# Patient Record
Sex: Male | Born: 1960 | Hispanic: No | Marital: Single | Smoking: Former smoker
Health system: Southern US, Community
[De-identification: ages and names within clinical notes are randomized; demographics above are authoritative.]

## PROBLEM LIST (undated history)

## (undated) DIAGNOSIS — K219 Gastro-esophageal reflux disease without esophagitis: Secondary | ICD-10-CM

## (undated) DIAGNOSIS — Z87898 Personal history of other specified conditions: Secondary | ICD-10-CM

## (undated) DIAGNOSIS — K529 Noninfective gastroenteritis and colitis, unspecified: Secondary | ICD-10-CM

---

## 2006-07-30 ENCOUNTER — Emergency Department (HOSPITAL_COMMUNITY): Admission: EM | Admit: 2006-07-30 | Discharge: 2006-07-30 | Payer: Self-pay | Admitting: Emergency Medicine

## 2019-10-11 ENCOUNTER — Ambulatory Visit: Payer: Self-pay

## 2019-10-11 ENCOUNTER — Ambulatory Visit (HOSPITAL_BASED_OUTPATIENT_CLINIC_OR_DEPARTMENT_OTHER)
Admission: RE | Admit: 2019-10-11 | Discharge: 2019-10-11 | Disposition: A | Discharge status: Home / Self Care | Payer: 59 | Source: Ambulatory Visit | Attending: Family Medicine | Admitting: Family Medicine

## 2019-10-11 ENCOUNTER — Ambulatory Visit (INDEPENDENT_AMBULATORY_CARE_PROVIDER_SITE_OTHER): Payer: 59 | Admitting: Family Medicine

## 2019-10-11 ENCOUNTER — Encounter: Payer: Self-pay | Admitting: Family Medicine

## 2019-10-11 ENCOUNTER — Other Ambulatory Visit: Payer: Self-pay

## 2019-10-11 VITALS — BP 132/87 | HR 64 | Ht 68.0 in | Wt 214.0 lb

## 2019-10-11 DIAGNOSIS — M1711 Unilateral primary osteoarthritis, right knee: Secondary | ICD-10-CM

## 2019-10-11 NOTE — Assessment & Plan Note (Signed)
Generative changes appreciated with joint space narrowing as likely the source of his pain.  Meniscus did not have significant degenerative changes appreciated. -Counseled on home exercise therapy and supportive care. -Provided Pennsaid sample. -X-ray. -Could consider injection, physical therapy

## 2019-10-11 NOTE — Progress Notes (Signed)
Medication Samples have been provided to the patient.  Drug name: Pennsaid       Strength: 2%      Qty: 1 box  LOT: [id number]  Exp.Date: 04/2020  Dosing instructions: use a pea size amount and rub gently.  The patient has been instructed regarding the correct time, dose, and frequency of taking this medication, including desired effects and most common side effects.   Kathi Simpers, MA 10:40 AM 10/11/2019

## 2019-10-11 NOTE — Progress Notes (Signed)
Casey Patterson - 59 y.o. male MRN [id number]  Date of birth: 1961-01-20  SUBJECTIVE:  Including CC & ROS.  Chief Complaint  Patient presents with  . Knee Pain    right    Casey Patterson is a 59 y.o. male that is presenting with right knee pain.  The pain is anterior medial in nature.  He reports having swelling intermittently.  Denies history of surgery.  No specific inciting event.  Has not tried anything for the pain.   Review of Systems See HPI   HISTORY: Past Medical, Surgical, Social, and Family History Reviewed & Updated per EMR.   Pertinent Historical Findings include:  History reviewed. No pertinent past medical history.  History reviewed. No pertinent surgical history.  History reviewed. No pertinent family history.  Social History   Socioeconomic History  . Marital status: Unknown    Spouse name: Not on file  . Number of children: Not on file  . Years of education: Not on file  . Highest education level: Not on file  Occupational History  . Not on file  Tobacco Use  . Smoking status: Former Games developer  . Smokeless tobacco: Never Used  Substance and Sexual Activity  . Alcohol use: Not on file  . Drug use: Not on file  . Sexual activity: Not on file  Other Topics Concern  . Not on file  Social History Narrative  . Not on file   Social Determinants of Health   Financial Resource Strain:   . Difficulty of Paying Living Expenses:   Food Insecurity:   . Worried About Programme researcher, broadcasting/film/video in the Last Year:   . Barista in the Last Year:   Transportation Needs:   . Freight forwarder (Medical):   Marland Kitchen Lack of Transportation (Non-Medical):   Physical Activity:   . Days of Exercise per Week:   . Minutes of Exercise per Session:   Stress:   . Feeling of Stress :   Social Connections:   . Frequency of Communication with Friends and Family:   . Frequency of Social Gatherings with Friends and Family:   . Attends Religious Services:   . Active Member  of Clubs or Organizations:   . Attends Banker Meetings:   Marland Kitchen Marital Status:   Intimate Partner Violence:   . Fear of Current or Ex-Partner:   . Emotionally Abused:   Marland Kitchen Physically Abused:   . Sexually Abused:      PHYSICAL EXAM:  VS: BP (!) 132/87   Pulse 64   Ht 5\' 8"  (1.727 m)   Wt (!) 214 lb (97.1 kg)   BMI 32.54 kg/m  Physical Exam Gen: NAD, alert, cooperative with exam, well-appearing MSK:  Right knee: Mild effusion. Tenderness palpation of the medial joint space. Normal range of motion. No instability with valgus or varus stress testing. Negative McMurray's test. Neurovascularly intact  Limited ultrasound: Right knee:  Mild to moderate effusion. Normal-appearing quadricep and patellar tendon. Medial joint space narrowing with normal-appearing meniscus. Lateral joint space narrowing with normal meniscus.  Summary: Degenerative change appreciated in the joint space.  Ultrasound and interpretation by , MD    ASSESSMENT & PLAN:   Primary osteoarthritis of right knee Generative changes appreciated with joint space narrowing as likely the source of his pain.  Meniscus did not have significant degenerative changes appreciated. -Counseled on home exercise therapy and supportive care. -Provided Pennsaid sample. -X-ray. -Could consider injection, physical therapy

## 2019-10-11 NOTE — Patient Instructions (Signed)
Nice to meet you Please try ice  Please try the exercises  Please try the rub on medicine  I will call with the results from today   Please send me a message in MyChart with any questions or updates.  Please see me back in 4 weeks.   --Dr. Jordan Likes

## 2019-10-12 ENCOUNTER — Telehealth: Payer: Self-pay | Admitting: Family Medicine

## 2019-10-12 ENCOUNTER — Ambulatory Visit: Payer: Self-pay | Admitting: Family Medicine

## 2019-10-12 NOTE — Telephone Encounter (Signed)
Informed of results.   Myra Rude, MD [facility] 10/12/2019, 9:08 AM

## 2019-11-08 ENCOUNTER — Encounter: Payer: Self-pay | Admitting: Family Medicine

## 2019-11-08 ENCOUNTER — Ambulatory Visit: Payer: 59 | Admitting: Family Medicine

## 2019-11-08 ENCOUNTER — Other Ambulatory Visit: Payer: Self-pay

## 2019-11-08 VITALS — BP 144/75 | HR 74 | Ht 68.0 in | Wt 207.0 lb

## 2019-11-08 DIAGNOSIS — M1711 Unilateral primary osteoarthritis, right knee: Secondary | ICD-10-CM

## 2019-11-08 MED ORDER — DICLOFENAC SODIUM 1 % EX GEL: 4.0000 g | Freq: Four times a day (QID) | CUTANEOUS | 11 refills | Status: AC

## 2019-11-08 NOTE — Progress Notes (Signed)
  Casey Patterson - 59 y.o. male MRN [id number]  Date of birth: 11/24/1960  SUBJECTIVE:  Including CC & ROS.  Chief Complaint  Patient presents with  . Follow-up    right knee    Casey Patterson is a 59 y.o. male that is following up for his right knee pain. The pain is mild intermittent in nature. He did get relief with the Pennsaid. Denies any mechanical symptoms. Does get swelling from time to time. He has been exercising on a regular basis.   Review of Systems See HPI   HISTORY: Past Medical, Surgical, Social, and Family History Reviewed & Updated per EMR.   Pertinent Historical Findings include:  No past medical history on file.  No past surgical history on file.  No family history on file.  Social History   Socioeconomic History  . Marital status: Unknown    Spouse name: Not on file  . Number of children: Not on file  . Years of education: Not on file  . Highest education level: Not on file  Occupational History  . Not on file  Tobacco Use  . Smoking status: Former Games developer  . Smokeless tobacco: Never Used  Substance and Sexual Activity  . Alcohol use: Not on file  . Drug use: Not on file  . Sexual activity: Not on file  Other Topics Concern  . Not on file  Social History Narrative  . Not on file   Social Determinants of Health   Financial Resource Strain:   . Difficulty of Paying Living Expenses: Not on file  Food Insecurity:   . Worried About Programme researcher, broadcasting/film/video in the Last Year: Not on file  . Ran Out of Food in the Last Year: Not on file  Transportation Needs:   . Lack of Transportation (Medical): Not on file  . Lack of Transportation (Non-Medical): Not on file  Physical Activity:   . Days of Exercise per Week: Not on file  . Minutes of Exercise per Session: Not on file  Stress:   . Feeling of Stress : Not on file  Social Connections:   . Frequency of Communication with Friends and Family: Not on file  . Frequency of Social Gatherings with Friends  and Family: Not on file  . Attends Religious Services: Not on file  . Active Member of Clubs or Organizations: Not on file  . Attends Banker Meetings: Not on file  . Marital Status: Not on file  Intimate Partner Violence:   . Fear of Current or Ex-Partner: Not on file  . Emotionally Abused: Not on file  . Physically Abused: Not on file  . Sexually Abused: Not on file     PHYSICAL EXAM:  VS: BP (!) 144/75   Pulse 74   Ht 5\' 8"  (1.727 m)   Wt 207 lb (93.9 kg)   BMI 31.47 kg/m  Physical Exam Gen: NAD, alert, cooperative with exam, well-appearing   ASSESSMENT & PLAN:   Primary osteoarthritis of right knee Doing well with no significant pain. -Counseled on home exercise therapy and supportive care. -Voltaren gel. -Could consider physical therapy or injection.

## 2019-11-08 NOTE — Assessment & Plan Note (Signed)
Doing well with no significant pain. -Counseled on home exercise therapy and supportive care. -Voltaren gel. -Could consider physical therapy or injection.

## 2019-11-08 NOTE — Patient Instructions (Signed)
Good to see you Please continue the exercises  Please use ice as needed  Please try the rub on medicine if needed   Please send me a message in MyChart with any questions or updates.  Please see me back in 6 weeks or as needed.   --Dr. Jordan Likes

## 2019-12-20 ENCOUNTER — Ambulatory Visit: Payer: 59 | Admitting: Family Medicine

## 2019-12-21 ENCOUNTER — Encounter: Payer: Self-pay | Admitting: Family Medicine

## 2019-12-21 ENCOUNTER — Ambulatory Visit (INDEPENDENT_AMBULATORY_CARE_PROVIDER_SITE_OTHER): Payer: 59 | Admitting: Family Medicine

## 2019-12-21 ENCOUNTER — Other Ambulatory Visit: Payer: Self-pay

## 2019-12-21 DIAGNOSIS — M1711 Unilateral primary osteoarthritis, right knee: Secondary | ICD-10-CM

## 2019-12-21 NOTE — Progress Notes (Signed)
  Casey Patterson - 59 y.o. male MRN [id number]  Date of birth: 03/27/1960  SUBJECTIVE:  Including CC & ROS.  Chief Complaint  Patient presents with  . Follow-up    right knee    Casey Patterson is a 59 y.o. male that is following up for his right knee pain.  He has been doing well.  He continues to exercise.  He only gets swelling for intermittently.  Has good strength and range of motion.   Review of Systems See HPI   HISTORY: Past Medical, Surgical, Social, and Family History Reviewed & Updated per EMR.   Pertinent Historical Findings include:  No past medical history on file.  No past surgical history on file.  No family history on file.  Social History   Socioeconomic History  . Marital status: Unknown    Spouse name: Not on file  . Number of children: Not on file  . Years of education: Not on file  . Highest education level: Not on file  Occupational History  . Not on file  Tobacco Use  . Smoking status: Former Games developer  . Smokeless tobacco: Never Used  Substance and Sexual Activity  . Alcohol use: Not on file  . Drug use: Not on file  . Sexual activity: Not on file  Other Topics Concern  . Not on file  Social History Narrative  . Not on file   Social Determinants of Health   Financial Resource Strain:   . Difficulty of Paying Living Expenses: Not on file  Food Insecurity:   . Worried About Programme researcher, broadcasting/film/video in the Last Year: Not on file  . Ran Out of Food in the Last Year: Not on file  Transportation Needs:   . Lack of Transportation (Medical): Not on file  . Lack of Transportation (Non-Medical): Not on file  Physical Activity:   . Days of Exercise per Week: Not on file  . Minutes of Exercise per Session: Not on file  Stress:   . Feeling of Stress : Not on file  Social Connections:   . Frequency of Communication with Friends and Family: Not on file  . Frequency of Social Gatherings with Friends and Family: Not on file  . Attends Religious Services:  Not on file  . Active Member of Clubs or Organizations: Not on file  . Attends Banker Meetings: Not on file  . Marital Status: Not on file  Intimate Partner Violence:   . Fear of Current or Ex-Partner: Not on file  . Emotionally Abused: Not on file  . Physically Abused: Not on file  . Sexually Abused: Not on file     PHYSICAL EXAM:  VS: BP (!) 144/80   Pulse 74   Ht 5\' 8"  (1.727 m)   Wt 194 lb (88 kg)   BMI 29.50 kg/m  Physical Exam Gen: NAD, alert, cooperative with exam, well-appearing   ASSESSMENT & PLAN:   Primary osteoarthritis of right knee Is doing well.  Has started a new job with over the past 4 weeks.  Does get some irritation with repetitive bending at work. -Counseled on home exercise therapy and supportive care. -Brace. -Could consider injection if needed.

## 2019-12-21 NOTE — Assessment & Plan Note (Signed)
Is doing well.  Has started a new job with over the past 4 weeks.  Does get some irritation with repetitive bending at work. -Counseled on home exercise therapy and supportive care. -Brace. -Could consider injection if needed.

## 2019-12-21 NOTE — Patient Instructions (Signed)
Good to see you Congrats on the weight loss. Keep up the good work  Please continue the exercises  Please use ice as needed   Please send me a message in MyChart with any questions or updates.  Please see [place] back as needed.   --Dr. Jordan Likes

## 2022-02-02 IMAGING — DX DG KNEE COMPLETE 4+V*R*
4 series · 4 of 4 positions shown · non-contrast
Comparison: None.

CLINICAL DATA: Right knee pain.

EXAM:
RIGHT KNEE - COMPLETE 4+ VIEW

[knee ap]
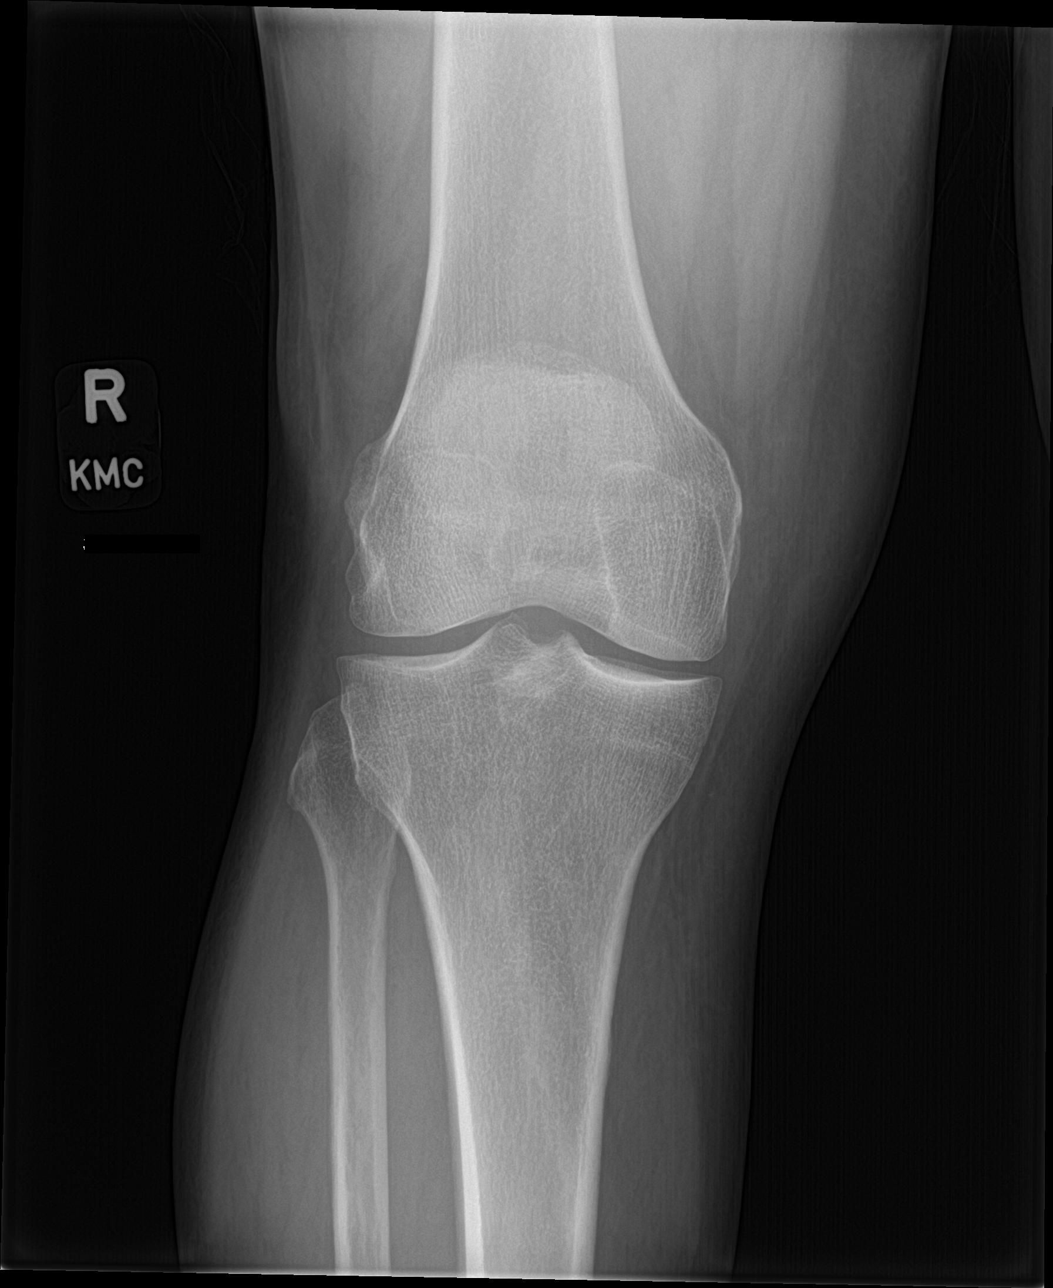

[tunnel]
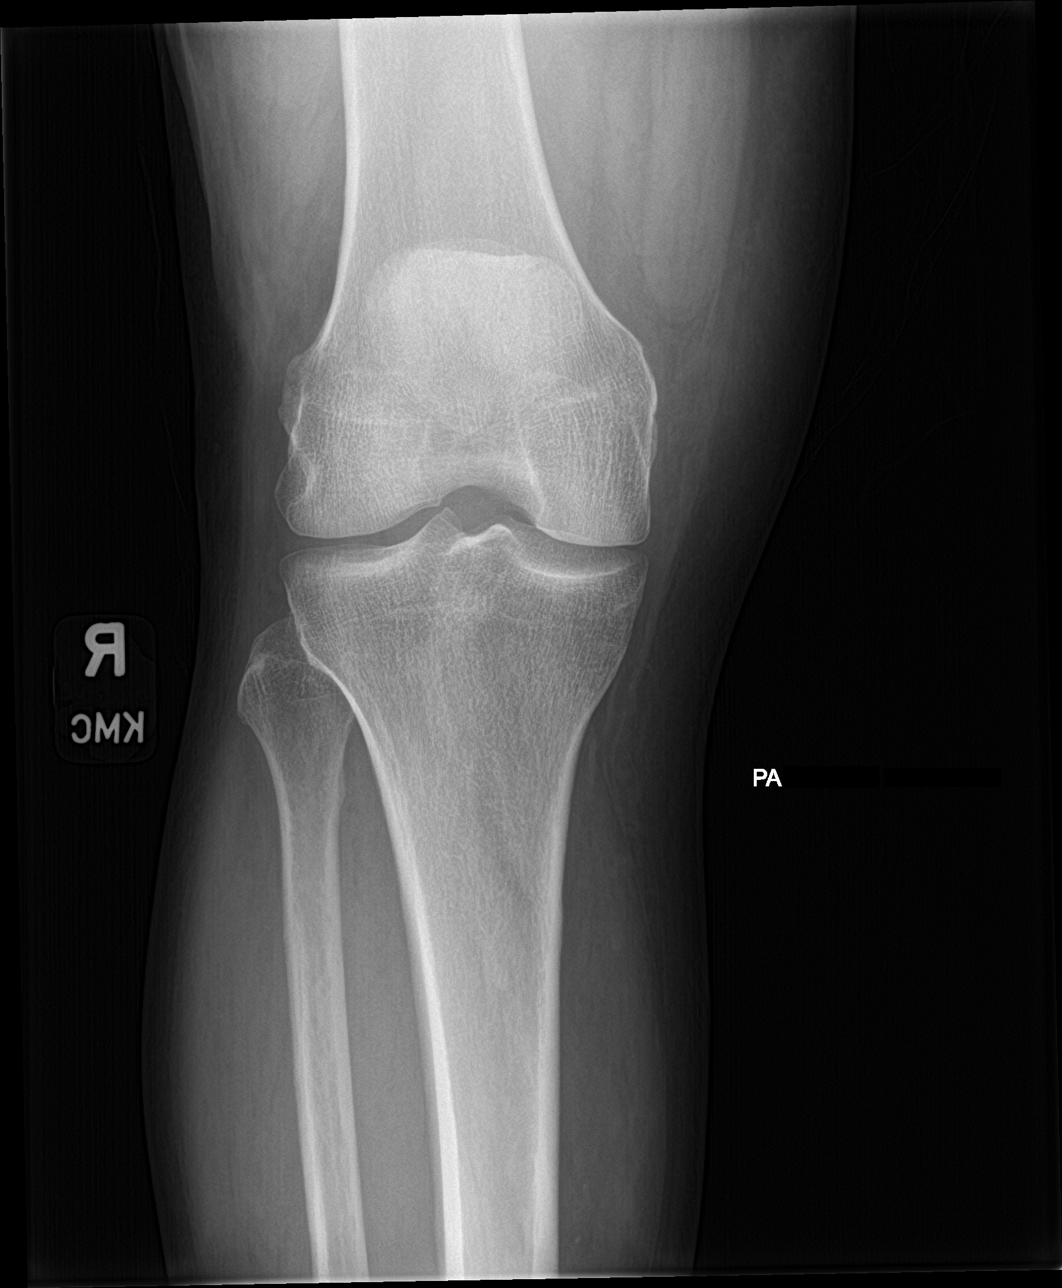

[knee lat]
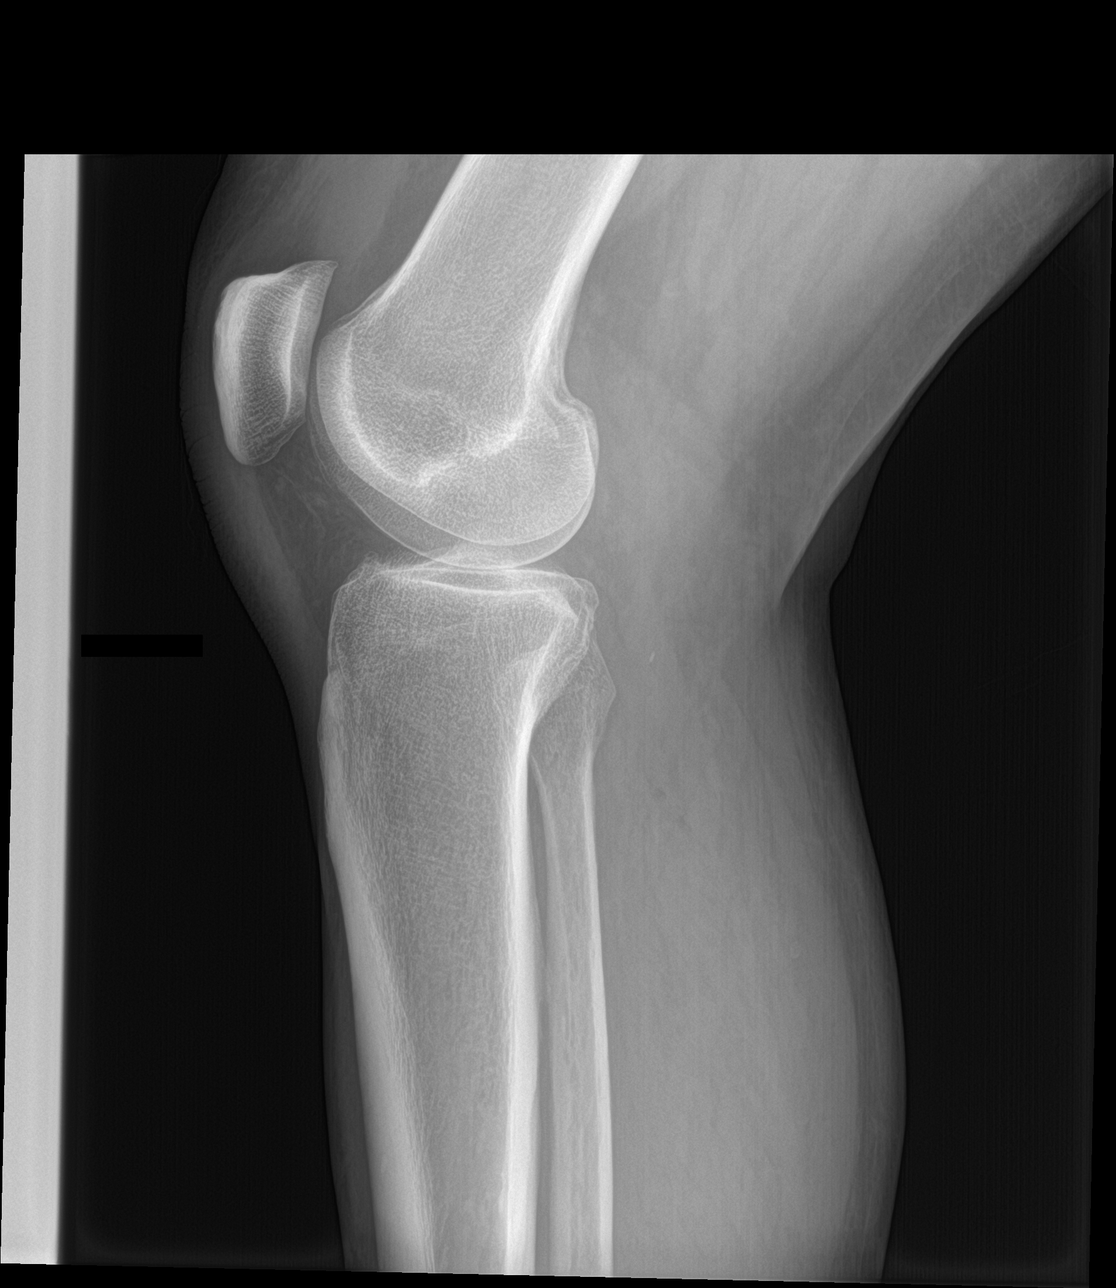

[knee sunrise]
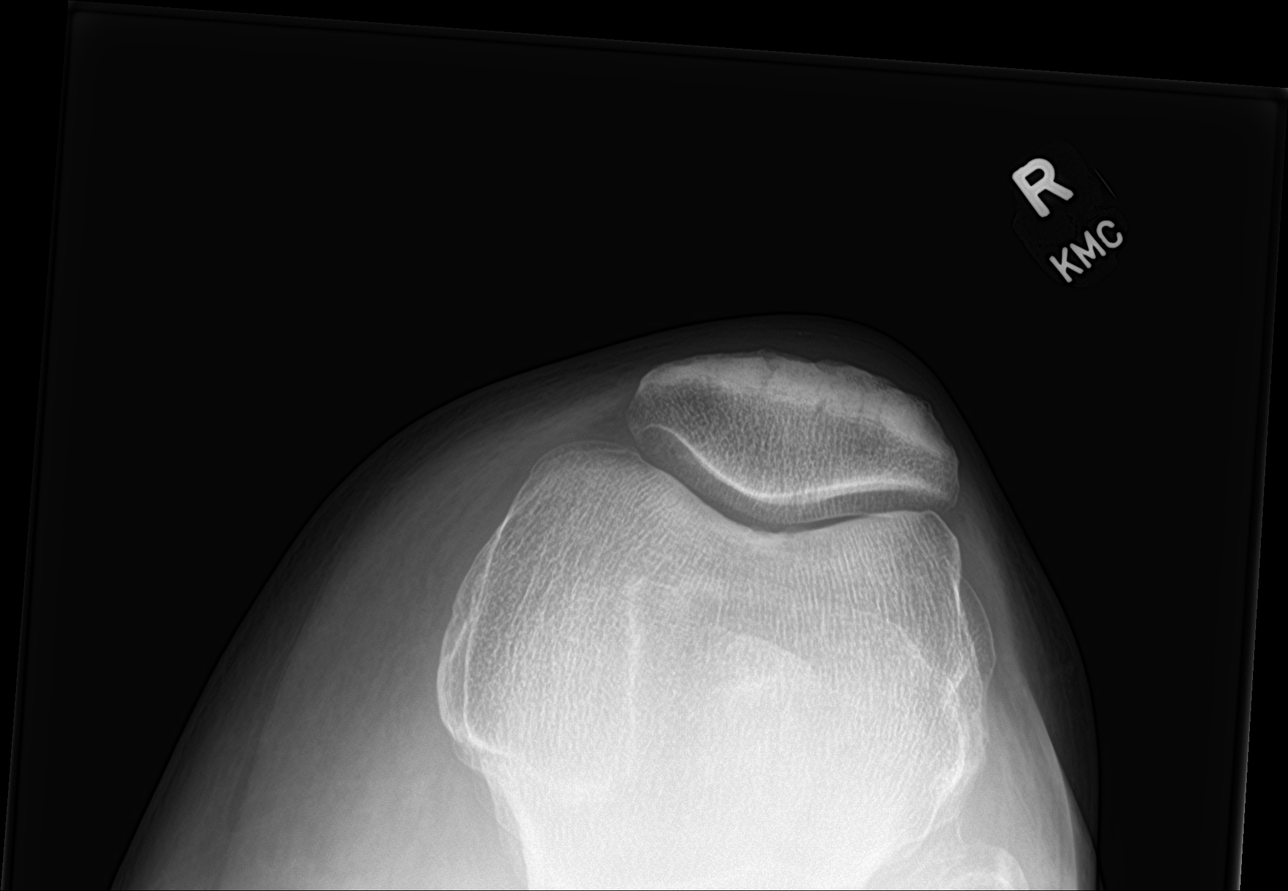

[4 of 4 positions shown; findings below may reference images not displayed]

FINDINGS: No evidence of fracture, dislocation, or joint effusion. No evidence
of arthropathy or other focal bone abnormality. Soft tissues are
unremarkable.
IMPRESSION: Negative.

## 2022-06-24 ENCOUNTER — Encounter: Payer: Self-pay | Admitting: *Deleted

## 2022-10-31 ENCOUNTER — Encounter (HOSPITAL_BASED_OUTPATIENT_CLINIC_OR_DEPARTMENT_OTHER): Payer: Self-pay | Admitting: *Deleted

## 2022-10-31 ENCOUNTER — Emergency Department (HOSPITAL_BASED_OUTPATIENT_CLINIC_OR_DEPARTMENT_OTHER)
Admission: EM | Admit: 2022-10-31 | Discharge: 2022-10-31 | Disposition: A | Discharge status: Home / Self Care | Payer: BC Managed Care – PPO | Attending: Emergency Medicine | Admitting: Emergency Medicine

## 2022-10-31 ENCOUNTER — Other Ambulatory Visit: Payer: Self-pay

## 2022-10-31 DIAGNOSIS — R0981 Nasal congestion: Secondary | ICD-10-CM | POA: Diagnosis not present

## 2022-10-31 DIAGNOSIS — R300 Dysuria: Secondary | ICD-10-CM | POA: Diagnosis present

## 2022-10-31 DIAGNOSIS — R059 Cough, unspecified: Secondary | ICD-10-CM | POA: Insufficient documentation

## 2022-10-31 DIAGNOSIS — Z1152 Encounter for screening for COVID-19: Secondary | ICD-10-CM | POA: Insufficient documentation

## 2022-10-31 DIAGNOSIS — M791 Myalgia, unspecified site: Secondary | ICD-10-CM | POA: Insufficient documentation

## 2022-10-31 HISTORY — DX: Gastro-esophageal reflux disease without esophagitis: K21.9

## 2022-10-31 HISTORY — DX: Noninfective gastroenteritis and colitis, unspecified: K52.9

## 2022-10-31 HISTORY — DX: Personal history of other specified conditions: Z87.898

## 2022-10-31 LAB — URINALYSIS, W/ REFLEX TO CULTURE (INFECTION SUSPECTED)
Bacteria, UA: NONE SEEN
Bilirubin Urine: NEGATIVE
Glucose, UA: NEGATIVE mg/dL
Hgb urine dipstick: NEGATIVE
Ketones, ur: NEGATIVE mg/dL
Leukocytes,Ua: NEGATIVE
Nitrite: NEGATIVE
Protein, ur: NEGATIVE mg/dL
Specific Gravity, Urine: 1.025 (ref 1.005–1.030)
Squamous Epithelial / HPF: NONE SEEN /HPF (ref 0–5)
pH: 7.5 (ref 5.0–8.0)

## 2022-10-31 LAB — RPR: RPR Ser Ql: NONREACTIVE

## 2022-10-31 LAB — HIV ANTIBODY (ROUTINE TESTING W REFLEX): HIV Screen 4th Generation wRfx: NONREACTIVE

## 2022-10-31 LAB — GROUP A STREP BY PCR: Group A Strep by PCR: NOT DETECTED

## 2022-10-31 LAB — SARS CORONAVIRUS 2 BY RT PCR: SARS Coronavirus 2 by RT PCR: NEGATIVE

## 2022-10-31 MED ORDER — CEFTRIAXONE SODIUM 500 MG IJ SOLR
500.0000 mg | Freq: Once | INTRAMUSCULAR | Status: AC
  Administered 2022-10-31: 500 mg via INTRAMUSCULAR
  Filled 2022-10-31: qty 500

## 2022-10-31 MED ORDER — DOXYCYCLINE HYCLATE 100 MG PO TABS: 100.0000 mg | ORAL_TABLET | Freq: Two times a day (BID) | ORAL | 0 refills | Status: AC

## 2022-10-31 MED ORDER — LIDOCAINE HCL (PF) 1 % IJ SOLN
1.0000 mL | Freq: Once | INTRAMUSCULAR | Status: AC
  Administered 2022-10-31: 1.2 mL
  Filled 2022-10-31: qty 5

## 2022-10-31 MED ORDER — IBUPROFEN 600 MG PO TABS: 600.0000 mg | ORAL_TABLET | Freq: Three times a day (TID) | ORAL | 0 refills | Status: AC | PRN

## 2022-10-31 MED ORDER — IBUPROFEN 400 MG PO TABS
600.0000 mg | ORAL_TABLET | Freq: Once | ORAL | Status: AC
  Administered 2022-10-31: 600 mg via ORAL
  Filled 2022-10-31: qty 1

## 2022-10-31 NOTE — ED Provider Notes (Signed)
[place] [facility] Provider Note   CSN: [id number] Arrival date & time: 10/31/22  4098     History  Chief Complaint  Patient presents with   Illness    Casey Patterson is a 62 y.o. male.   Illness  Patient has a history of prediabetes and acid reflux.  As result of  Patient states he had intercourse several days ago with a new partner.  Patient states a couple days after that he started feeling ill.  He has had trouble with headache runny nose congestion and a cough.  He feels like his eyes are crusting and irritated.  He has felt chilled.  He has had bodyaches.  Patient also had some nausea and occasional loose stools.  He is felt feverish although has not measured a temperature.  Patient also noted a large amount of blood in his urine today.  He denies any penile discharge but it is painful when he urinates.  Home Medications Prior to Admission medications   Medication Sig Start Date End Date Taking? Authorizing Provider  doxycycline (VIBRA-TABS) 100 MG tablet Take 1 tablet (100 mg total) by mouth 2 (two) times daily for 7 days. 10/31/22 11/07/22 Yes Linwood Dibbles, MD  ibuprofen (ADVIL) 600 MG tablet Take 1 tablet (600 mg total) by mouth every 8 (eight) hours as needed. 10/31/22  Yes Linwood Dibbles, MD  diclofenac Sodium (VOLTAREN) 1 % GEL Apply 4 g topically 4 (four) times daily. To affected joint. 11/08/19   Myra Rude, MD      Allergies    Patient has no known allergies.    Review of Systems   Review of Systems  Physical Exam Updated Vital Signs BP 131/87 (BP Location: Right Arm)   Pulse 100   Temp 99.8 F (37.7 C) (Oral)   Resp 20   Ht 1.727 m (5\' 8" )   Wt 83.5 kg   SpO2 94%   BMI 27.98 kg/m  Physical Exam Vitals and nursing note reviewed.  Constitutional:      General: He is not in acute distress.    Appearance: He is well-developed.  HENT:     Head: Normocephalic and atraumatic.     Right Ear: External ear normal.      Left Ear: External ear normal.     Nose: No congestion or rhinorrhea.     Mouth/Throat:     Pharynx: No oropharyngeal exudate.     Comments: Mild conj injection bilaterally Eyes:     General: No scleral icterus.       Right eye: No discharge.        Left eye: No discharge.     Conjunctiva/sclera: Conjunctivae normal.  Neck:     Trachea: No tracheal deviation.  Cardiovascular:     Rate and Rhythm: Normal rate and regular rhythm.  Pulmonary:     Effort: Pulmonary effort is normal. No respiratory distress.     Breath sounds: Normal breath sounds. No stridor. No wheezing or rales.  Abdominal:     General: Bowel sounds are normal. There is no distension.     Palpations: Abdomen is soft.     Tenderness: There is no abdominal tenderness. There is no guarding or rebound.  Genitourinary:    Comments: Urine sample, yellow in color, no gross blood inoted Musculoskeletal:        General: No tenderness or deformity.     Cervical back: Neck supple.  Skin:    General: Skin  is warm and dry.     Findings: No rash.  Neurological:     General: No focal deficit present.     Mental Status: He is alert.     Cranial Nerves: No cranial nerve deficit, dysarthria or facial asymmetry.     Sensory: No sensory deficit.     Motor: No abnormal muscle tone or seizure activity.     Coordination: Coordination normal.  Psychiatric:        Mood and Affect: Mood normal.     ED Results / Procedures / Treatments   Labs (all labs ordered are listed, but only abnormal results are displayed) Labs Reviewed  GROUP A STREP BY PCR  SARS CORONAVIRUS 2 BY RT PCR  URINALYSIS, W/ REFLEX TO CULTURE (INFECTION SUSPECTED)  RPR  HIV ANTIBODY (ROUTINE TESTING W REFLEX)  GC/CHLAMYDIA PROBE AMP ([place]) NOT AT [facility]    EKG None  Radiology No results found.  Procedures Procedures    Medications Ordered in ED Medications  cefTRIAXone (ROCEPHIN) injection 500 mg (has no administration in time range)   lidocaine (PF) (XYLOCAINE) 1 % injection 1-2.1 mL (has no administration in time range)  ibuprofen (ADVIL) tablet 600 mg (600 mg Oral Given 10/31/22 1020)    ED Course/ Medical Decision Making/ A&P Clinical Course as of 10/31/22 1123  Thu Oct 31, 2022  1103 Urinalysis without signs of significant hematuria or infection.  COVID and strep test negative [JK]    Clinical Course User Index [JK] Linwood Dibbles, MD                                 Medical Decision Making Differential diagnosis includes but not limited to urinary tract infection, STI, kidney stone  Problems Addressed: Dysuria: acute illness or injury  Amount and/or Complexity of Data Reviewed Labs: ordered.  Risk Prescription drug management.   Patient presented with several symptoms.  Patient does admit to recent congestion headaches body aches.  COVID-negative here in the ED.  Strep test negative.  Possibly may have some other type of viral.  Patient is alert nontoxic.  Afebrile.  No meningismus.  No focal sinus tenderness.  No otitis media.  No signs of serious bacterial infection  Patient also admits to dysuria with recent unprotected intercourse.  Urinalysis not suggestive of infection or kidney stone.  Will treat empirically for possible urethritis       Final Clinical Impression(s) / ED Diagnoses Final diagnoses:  Dysuria    Rx / DC Orders ED Discharge Orders          Ordered    doxycycline (VIBRA-TABS) 100 MG tablet  2 times daily        10/31/22 1118    ibuprofen (ADVIL) 600 MG tablet  Every 8 hours PRN        10/31/22 1121              Linwood Dibbles, MD 10/31/22 1123

## 2022-10-31 NOTE — Discharge Instructions (Signed)
Take the medications as prescribed.  Make sure to take all of the antibiotics until they are finished.  Follow-up with your primary care doctor to be rechecked if the symptoms have not resolved by next week.  Return as needed for high fevers or other concerning symptoms

## 2022-10-31 NOTE — ED Triage Notes (Addendum)
Here by POV from home for illness, onset 3d ago, sx include: runny nose, productive cough, HA, eye irritation, hot and cold chills, body aches, hematuria, dysuria, diarrhea, nausea, and subjective fever. Alert, NAD, calm, interactive, steady gait.

## 2022-10-31 NOTE — ED Notes (Signed)
EDP at [facility] 

## 2022-11-01 LAB — GC/CHLAMYDIA PROBE AMP (~~LOC~~) NOT AT ARMC
Chlamydia: NEGATIVE
Comment: NEGATIVE
Comment: NORMAL
Neisseria Gonorrhea: NEGATIVE

## 2023-06-02 ENCOUNTER — Other Ambulatory Visit: Payer: Self-pay

## 2023-06-02 ENCOUNTER — Encounter (HOSPITAL_BASED_OUTPATIENT_CLINIC_OR_DEPARTMENT_OTHER): Payer: Self-pay

## 2023-06-02 ENCOUNTER — Emergency Department (HOSPITAL_BASED_OUTPATIENT_CLINIC_OR_DEPARTMENT_OTHER)
Admission: EM | Admit: 2023-06-02 | Discharge: 2023-06-02 | Disposition: A | Discharge status: Home / Self Care | Attending: Emergency Medicine | Admitting: Emergency Medicine

## 2023-06-02 DIAGNOSIS — H579 Unspecified disorder of eye and adnexa: Secondary | ICD-10-CM | POA: Diagnosis present

## 2023-06-02 DIAGNOSIS — H1089 Other conjunctivitis: Secondary | ICD-10-CM | POA: Diagnosis not present

## 2023-06-02 DIAGNOSIS — H1033 Unspecified acute conjunctivitis, bilateral: Secondary | ICD-10-CM

## 2023-06-02 MED ORDER — CIPROFLOXACIN HCL 0.3 % OP SOLN: 2.0000 [drp] | OPHTHALMIC | 0 refills | Status: DC

## 2023-06-02 MED ORDER — CIPROFLOXACIN HCL 0.3 % OP SOLN: 2.0000 [drp] | OPHTHALMIC | 0 refills | Status: AC

## 2023-06-02 NOTE — ED Provider Notes (Signed)
 [place] [facility] Provider Note   CSN: [id number] Arrival date & time: 06/02/23  0422     History  Chief Complaint  Patient presents with   Eye Problem    Casey Patterson is a 62 y.o. male.  The history is provided by the patient.  Eye Problem Location:  Both eyes Quality:  Aching Severity:  Mild Onset quality:  Sudden Timing:  Constant Progression:  Unchanged Chronicity:  New Context: not burn   Relieved by:  Nothing Worsened by:  Nothing Ineffective treatments:  None tried Associated symptoms: no decreased vision   Associated symptoms comment:  Purulent drainage  Risk factors: no conjunctival hemorrhage   No contact lenses, no exposure.  No trauma      Home Medications Prior to Admission medications   Medication Sig Start Date End Date Taking? Authorizing Provider  ciprofloxacin (CILOXAN) 0.3 % ophthalmic solution Place 2 drops into both eyes every 4 (four) hours while awake. Administer 1 drop, every 2 hours, while awake, for 2 days. Then 1 drop, every 4 hours, while awake, for the next 5 days. 06/02/23  Yes Alvaro Aungst, MD  diclofenac Sodium (VOLTAREN) 1 % GEL Apply 4 g topically 4 (four) times daily. To affected joint. 11/08/19   Myra Rude, MD  ibuprofen (ADVIL) 600 MG tablet Take 1 tablet (600 mg total) by mouth every 8 (eight) hours as needed. 10/31/22   Linwood Dibbles, MD      Allergies    Patient has no known allergies.    Review of Systems   Review of Systems  Physical Exam Updated Vital Signs BP 116/76 (BP Location: Right Arm)   Pulse 63   Temp 98.7 F (37.1 C) (Oral)   Resp 20   SpO2 97%  Physical Exam Vitals and nursing note reviewed. Exam conducted with a chaperone present.  Constitutional:      General: He is not in acute distress.    Appearance: He is well-developed. He is not diaphoretic.  HENT:     Head: Normocephalic and atraumatic.  Eyes:     Extraocular Movements: Extraocular movements  intact.     Conjunctiva/sclera: Conjunctivae normal.     Pupils: Pupils are equal, round, and reactive to light.     Comments: Injection B with purulent drainage   Cardiovascular:     Rate and Rhythm: Normal rate and regular rhythm.     Pulses: Normal pulses.     Heart sounds: Normal heart sounds.  Pulmonary:     Effort: Pulmonary effort is normal.     Breath sounds: Normal breath sounds. No wheezing or rales.  Abdominal:     General: Bowel sounds are normal.     Palpations: Abdomen is soft.     Tenderness: There is no abdominal tenderness. There is no guarding or rebound.  Musculoskeletal:        General: Normal range of motion.     Cervical back: Normal range of motion and neck supple.  Skin:    General: Skin is warm and dry.     Capillary Refill: Capillary refill takes less than 2 seconds.  Neurological:     General: No focal deficit present.     Mental Status: He is alert and oriented to person, place, and time.     Deep Tendon Reflexes: Reflexes normal.  Psychiatric:        Mood and Affect: Mood normal.     ED Results / Procedures / Treatments  Labs (all labs ordered are listed, but only abnormal results are displayed) Labs Reviewed - No data to display  EKG None  Radiology No results found.  Procedures Procedures    Medications Ordered in ED Medications - No data to display  ED Course/ Medical Decision Making/ A&P                                 Medical Decision Making Sudden onset purulent drainage B at work   Amount and/or Complexity of Data Reviewed External Data Reviewed: notes.    Details: Previous notes reviewed   Risk Prescription drug management. Risk Details: Patient with B conjunctivitis will start eyedrops and refer to eye care physician for ongoing care.  Stable for discharge.         Final Clinical Impression(s) / ED Diagnoses Final diagnoses:  Acute bacterial conjunctivitis of both eyes    No signs of systemic illness or  infection. The patient is nontoxic-appearing on exam and vital signs are within normal limits.  I have reviewed the triage vital signs and the nursing notes. Pertinent labs & imaging results that were available during my care of the patient were reviewed by me and considered in my medical decision making (see chart for details). After history, exam, and medical workup I feel the patient has been appropriately medically screened and is safe for discharge home. Pertinent diagnoses were discussed with the patient. Patient was given return precautions.    Rx / DC Orders ED Discharge Orders          Ordered    ciprofloxacin (CILOXAN) 0.3 % ophthalmic solution  Every 4 hours while awake        06/02/23 0515              Avital Dancy, MD 06/02/23 0522

## 2023-06-02 NOTE — ED Triage Notes (Signed)
 Copious amounts of purulent drainage from BIL eyes that pt noticed just tonight. He said he noticed his vision was hazy and saw what looked to be pus in his eye. He states both eyes feel "scratchy". Pt given warm wash cloths to attempt to cleanse his eyes.

## 2023-12-03 ENCOUNTER — Encounter (HOSPITAL_BASED_OUTPATIENT_CLINIC_OR_DEPARTMENT_OTHER): Payer: Self-pay | Admitting: Emergency Medicine

## 2023-12-03 ENCOUNTER — Emergency Department (HOSPITAL_BASED_OUTPATIENT_CLINIC_OR_DEPARTMENT_OTHER)
Admission: EM | Admit: 2023-12-03 | Discharge: 2023-12-03 | Disposition: A | Discharge status: Home / Self Care | Attending: Emergency Medicine | Admitting: Emergency Medicine

## 2023-12-03 ENCOUNTER — Other Ambulatory Visit: Payer: Self-pay

## 2023-12-03 DIAGNOSIS — N492 Inflammatory disorders of scrotum: Secondary | ICD-10-CM | POA: Diagnosis not present

## 2023-12-03 DIAGNOSIS — L02416 Cutaneous abscess of left lower limb: Secondary | ICD-10-CM | POA: Diagnosis present

## 2023-12-03 DIAGNOSIS — L02214 Cutaneous abscess of groin: Secondary | ICD-10-CM | POA: Insufficient documentation

## 2023-12-03 DIAGNOSIS — L0291 Cutaneous abscess, unspecified: Secondary | ICD-10-CM

## 2023-12-03 MED ORDER — DOXYCYCLINE HYCLATE 100 MG PO CAPS: 100.0000 mg | ORAL_CAPSULE | Freq: Two times a day (BID) | ORAL | 0 refills | Status: AC

## 2023-12-03 MED ORDER — LIDOCAINE-EPINEPHRINE (PF) 2 %-1:200000 IJ SOLN
10.0000 mL | Freq: Once | INTRAMUSCULAR | Status: AC
Start: 2023-12-03 — End: 2023-12-03
  Administered 2023-12-03: 10 mL
  Filled 2023-12-03: qty 20

## 2023-12-03 MED ORDER — DOXYCYCLINE HYCLATE 100 MG PO TABS
100.0000 mg | ORAL_TABLET | Freq: Once | ORAL | Status: AC
  Administered 2023-12-03: 100 mg via ORAL
  Filled 2023-12-03: qty 1
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
## 2023-12-03 NOTE — ED Notes (Signed)
 Pt notes a red warm bump to top of left thigh, he also describes one located in his left groin, and then on the right side of the scrotum.

## 2023-12-03 NOTE — Discharge Instructions (Signed)
 As discussed, your abscess was drained on the emergency department.  Will place on antibiotics to treat infection.  Return if you develop fever, worsening redness or any other abnormality we discussed.

## 2023-12-03 NOTE — ED Triage Notes (Signed)
 Pt with abscess to LT thigh, significant redness and swelling surrounding

## 2023-12-05 ENCOUNTER — Encounter (HOSPITAL_BASED_OUTPATIENT_CLINIC_OR_DEPARTMENT_OTHER): Payer: Self-pay | Admitting: Emergency Medicine

## 2024-01-13 LAB — COLOGUARD: COLOGUARD: NEGATIVE

## 2024-01-26 ENCOUNTER — Encounter (HOSPITAL_BASED_OUTPATIENT_CLINIC_OR_DEPARTMENT_OTHER): Payer: Self-pay | Admitting: Urology

## 2024-01-26 ENCOUNTER — Other Ambulatory Visit: Payer: Self-pay

## 2024-01-26 ENCOUNTER — Emergency Department (HOSPITAL_BASED_OUTPATIENT_CLINIC_OR_DEPARTMENT_OTHER)

## 2024-01-26 ENCOUNTER — Emergency Department (HOSPITAL_BASED_OUTPATIENT_CLINIC_OR_DEPARTMENT_OTHER)
Admission: EM | Admit: 2024-01-26 | Discharge: 2024-01-26 | Disposition: A | Discharge status: Home / Self Care | Attending: Emergency Medicine | Admitting: Emergency Medicine

## 2024-01-26 DIAGNOSIS — R10A2 Flank pain, left side: Secondary | ICD-10-CM

## 2024-01-26 DIAGNOSIS — K4090 Unilateral inguinal hernia, without obstruction or gangrene, not specified as recurrent: Secondary | ICD-10-CM | POA: Diagnosis not present

## 2024-01-26 DIAGNOSIS — R350 Frequency of micturition: Secondary | ICD-10-CM | POA: Insufficient documentation

## 2024-01-26 DIAGNOSIS — R300 Dysuria: Secondary | ICD-10-CM | POA: Insufficient documentation

## 2024-01-26 LAB — URINALYSIS, W/ REFLEX TO CULTURE (INFECTION SUSPECTED)
Bilirubin Urine: NEGATIVE
Glucose, UA: NEGATIVE mg/dL
Hgb urine dipstick: NEGATIVE
Ketones, ur: NEGATIVE mg/dL
Leukocytes,Ua: NEGATIVE
Nitrite: NEGATIVE
Protein, ur: NEGATIVE mg/dL
Specific Gravity, Urine: 1.025 (ref 1.005–1.030)
pH: 5.5 (ref 5.0–8.0)

## 2024-01-26 LAB — COMPREHENSIVE METABOLIC PANEL WITH GFR
ALT: 14 U/L (ref 0–44)
AST: 17 U/L (ref 15–41)
Albumin: 4.1 g/dL (ref 3.5–5.0)
Alkaline Phosphatase: 91 U/L (ref 38–126)
Anion gap: 9 (ref 5–15)
BUN: 19 mg/dL (ref 8–23)
CO2: 25 mmol/L (ref 22–32)
Calcium: 9.3 mg/dL (ref 8.9–10.3)
Chloride: 104 mmol/L (ref 98–111)
Creatinine, Ser: 0.82 mg/dL (ref 0.61–1.24)
GFR, Estimated: >60 mL/min (ref >60)
Glucose, Bld: 112 mg/dL — ABNORMAL HIGH (ref 70–99)
Potassium: 4 mmol/L (ref 3.5–5.1)
Sodium: 138 mmol/L (ref 135–145)
Total Bilirubin: 0.4 mg/dL (ref 0.0–1.2)
Total Protein: 8 g/dL (ref 6.5–8.1)

## 2024-01-26 LAB — CBC WITH DIFFERENTIAL/PLATELET
Abs Immature Granulocytes: 0.03 K/uL (ref 0.00–0.07)
Basophils Absolute: 0.1 K/uL (ref 0.0–0.1)
Basophils Relative: 1 %
Eosinophils Absolute: 0.4 K/uL (ref 0.0–0.5)
Eosinophils Relative: 4 %
HCT: 42.8 % (ref 39.0–52.0)
Hemoglobin: 14 g/dL (ref 13.0–17.0)
Immature Granulocytes: 0 %
Lymphocytes Relative: 35 %
Lymphs Abs: 3.6 K/uL (ref 0.7–4.0)
MCH: 25.9 pg — ABNORMAL LOW (ref 26.0–34.0)
MCHC: 32.7 g/dL (ref 30.0–36.0)
MCV: 79.1 fL — ABNORMAL LOW (ref 80.0–100.0)
Monocytes Absolute: 1 K/uL (ref 0.1–1.0)
Monocytes Relative: 9 %
Neutro Abs: 5.2 K/uL (ref 1.7–7.7)
Neutrophils Relative %: 51 %
Platelets: 319 K/uL (ref 150–400)
RBC: 5.41 MIL/uL (ref 4.22–5.81)
RDW: 15.8 % — ABNORMAL HIGH (ref 11.5–15.5)
WBC: 10.2 K/uL (ref 4.0–10.5)
nRBC: 0 % (ref 0.0–0.2)

## 2024-01-26 LAB — LIPASE, BLOOD: Lipase: 18 U/L (ref 11–51)

## 2024-01-26 MED ORDER — IOHEXOL 300 MG/ML  SOLN
100.0000 mL | Freq: Once | INTRAMUSCULAR | Status: AC | PRN
  Administered 2024-01-26: 100 mL via INTRAVENOUS

## 2024-01-26 MED ORDER — CYCLOBENZAPRINE HCL 10 MG PO TABS: 10.0000 mg | ORAL_TABLET | Freq: Two times a day (BID) | ORAL | 0 refills | Status: AC | PRN

## 2024-01-26 MED ORDER — KETOROLAC TROMETHAMINE 15 MG/ML IJ SOLN
15.0000 mg | Freq: Once | INTRAMUSCULAR | Status: AC
  Administered 2024-01-26: 15 mg via INTRAVENOUS
  Filled 2024-01-26: qty 1

## 2024-01-26 MED ORDER — NAPROXEN 500 MG PO TABS: 500.0000 mg | ORAL_TABLET | Freq: Two times a day (BID) | ORAL | 0 refills | Status: AC

## 2024-01-26 MED ORDER — ONDANSETRON HCL 4 MG/2ML IJ SOLN
4.0000 mg | Freq: Once | INTRAMUSCULAR | Status: DC
  Filled 2024-01-26: qty 2

## 2024-01-26 MED ORDER — SODIUM CHLORIDE 0.9 % IV BOLUS
1000.0000 mL | Freq: Once | INTRAVENOUS | Status: AC
  Administered 2024-01-26: 1000 mL via INTRAVENOUS

## 2024-01-26 NOTE — ED Notes (Signed)
 D/c paperwork reviewed with pt, including prescriptions and follow up care.  No questions or concerns voiced at time of d/c. Marland Kitchen Pt verbalized understanding, ambulatory without assistance to ED exit, NAD.

## 2024-01-26 NOTE — ED Provider Notes (Signed)
 [place] [facility] Provider Note   CSN: [id number] Arrival date & time: 01/26/24  9141     Patient presents with: Flank Pain   Casey Patterson is a 63 y.o. male.   Patient is a 63 year old male with no significant medical history who presents to the ED for left-sided flank pain for the past 4 days.  States pain has been constant and does radiate to the right side of the back sometimes as well.  Denies fall or injury.  He notes he has had increased urinary frequency as well as as well as some dysuria.  Denies hematuria.  He had tried to take ibuprofen  with minimal relief.  Also notes associated nausea and subjective fevers.  Denies previous prostate history or any history of kidney stones.  States he has had 1 UTI previously.  Denies concerns for STIs or any penile pain/discharge.  Denies chills, headache, dizziness, chest pain, shortness of breath, abdominal pain, vomiting, diarrhea/constipation.  No further complaints.   Flank Pain Pertinent negatives include no chest pain, no abdominal pain and no shortness of breath.       Prior to Admission medications   Medication Sig Start Date End Date Taking? Authorizing Provider  clindamycin (CLEOCIN T) 1 % lotion Apply topically. 01/02/24  Yes [provider]  clobetasol ointment (TEMOVATE) 0.05 % Apply 1 Application topically. Apply to affected areas on scalp 2-3 times a week. DO NOT apply to face, underarms or groin 01/02/24  Yes [provider]  cyclobenzaprine (FLEXERIL) 10 MG tablet Take 1 tablet (10 mg total) by mouth 2 (two) times daily as needed for muscle spasms. 01/26/24  Yes Neysa Thersia RAMAN, PA-C  hydrocortisone 2.5 % ointment Apply to affected skin of groin 1-2 times a day as needed for rash/itching/scaling. 01/02/24  Yes [provider]  ketoconazole (NIZORAL) 2 % shampoo Apply 1 Application topically. 01/05/24 02/04/24 Yes [provider]  naproxen  (NAPROSYN) 500 MG tablet Take 1 tablet (500 mg total) by mouth 2 (two) times daily. 01/26/24  Yes Kharson Rasmusson, Thersia RAMAN, PA-C  nystatin-triamcinolone (MYCOLOG II) cream Apply 1 Application topically 2 (two) times daily. 12/24/23  Yes [provider]  Vitamin D, Ergocalciferol, (DRISDOL) 1.25 MG (50000 UNIT) CAPS capsule Take 50,000 Units by mouth once a week. 01/14/24  Yes [provider]  ciprofloxacin (CILOXAN) 0.3 % ophthalmic solution Place 2 drops into both eyes every 4 (four) hours while awake. 06/02/23   Palumbo, April, MD  diclofenac Sodium (VOLTAREN) 1 % GEL Apply 4 g topically 4 (four) times daily. To affected joint. 11/08/19   Chick Venetia BRAVO, MD  doxycycline  (VIBRAMYCIN ) 100 MG capsule Take 1 capsule (100 mg total) by mouth 2 (two) times daily. 12/03/23   Silver Wonda LABOR, PA  ibuprofen  (ADVIL ) 600 MG tablet Take 1 tablet (600 mg total) by mouth every 8 (eight) hours as needed. 10/31/22   Randol Simmonds, MD    Allergies: Patient has no known allergies.    Review of Systems  Constitutional:  Positive for fever. Negative for chills.  Respiratory:  Negative for shortness of breath.   Cardiovascular:  Negative for chest pain.  Gastrointestinal:  Positive for nausea. Negative for abdominal pain, constipation, diarrhea and vomiting.  Genitourinary:  Positive for dysuria, flank pain and frequency. Negative for hematuria.  All other systems reviewed and are negative.   Updated Vital Signs BP 139/82   Pulse (!) 50   Temp 97.7 F (36.5 C) (Oral)  Resp 17   Ht 5' 8 (1.727 m)   Wt 84.8 kg   SpO2 96%   BMI 28.43 kg/m   Physical Exam Constitutional:      Appearance: Normal appearance.  HENT:     Head: Normocephalic and atraumatic.     Mouth/Throat:     Mouth: Mucous membranes are moist.     Pharynx: Oropharynx is clear.  Cardiovascular:     Rate and Rhythm: Normal rate.  Pulmonary:     Effort: Pulmonary effort is normal.  Abdominal:     General: Bowel sounds are  normal.     Palpations: Abdomen is soft.     Tenderness: There is no abdominal tenderness. There is no right CVA tenderness or left CVA tenderness.     Comments: Tender to palpation on the left lower lateral flank.  No central lumbar tenderness.  Negative CVA tenderness bilaterally  Musculoskeletal:     Comments: PT pulses 2+ bilaterally.  Full range of motion of all 4 extremities with equal strength.  Ambulatory with a normal gait.  Skin:    General: Skin is warm and dry.  Neurological:     Mental Status: He is alert and oriented to person, place, and time.  Psychiatric:        Mood and Affect: Mood normal.        Behavior: Behavior normal.     (all labs ordered are listed, but only abnormal results are displayed) Labs Reviewed  COMPREHENSIVE METABOLIC PANEL WITH GFR - Abnormal; Notable for the following components:      Result Value   Glucose, Bld 112 (*)    All other components within normal limits  CBC WITH DIFFERENTIAL/PLATELET - Abnormal; Notable for the following components:   MCV 79.1 (*)    MCH 25.9 (*)    RDW 15.8 (*)    All other components within normal limits  URINALYSIS, W/ REFLEX TO CULTURE (INFECTION SUSPECTED) - Abnormal; Notable for the following components:   Bacteria, UA RARE (*)    All other components within normal limits  LIPASE, BLOOD    EKG: None  Radiology: CT ABDOMEN PELVIS W CONTRAST Result Date: 01/26/2024 CLINICAL DATA:  Left flank pain for 4 days. EXAM: CT ABDOMEN AND PELVIS WITH CONTRAST TECHNIQUE: Multidetector CT imaging of the abdomen and pelvis was performed using the standard protocol following bolus administration of intravenous contrast. RADIATION DOSE REDUCTION: This exam was performed according to the departmental dose-optimization program which includes automated exposure control, adjustment of the mA and/or kV according to patient size and/or use of iterative reconstruction technique. CONTRAST:  100mL OMNIPAQUE IOHEXOL 300 MG/ML  SOLN  COMPARISON:  09/19/2020. FINDINGS: Lower chest: Calcified granulomas. Minimal dependent atelectasis bilaterally. Heart is at the upper limits of normal in size. No pericardial or pleural effusion. Distal esophagus is grossly unremarkable. Hepatobiliary: Hepatic cysts. Blush of hyperattenuation associated with a low-attenuation lesion in the right hepatic lobe (2/13), likely a hemangioma. No specific follow-up necessary. Liver and gallbladder are otherwise unremarkable. No biliary ductal dilatation. Pancreas: Negative. Spleen: Negative. Adrenals/Urinary Tract: Adrenal glands are unremarkable. Small low-attenuation lesions in the kidneys. No specific follow-up necessary. Ureters are decompressed. Right anterolateral aspect of the bladder extends into a right inguinal hernia Stomach/Bowel: Tiny hiatal hernia. Stomach, small bowel, appendix and colon are unremarkable. Vascular/Lymphatic: Atherosclerotic calcification of the aorta. No pathologically enlarged lymph nodes. 8 mm gastrohepatic ligament lymph node (2/16), similar. Reproductive: Prostate is visualized. Other: Right inguinal hernia contains the right anterolateral aspect of  the bladder. No free fluid. Mesenteries and peritoneum are unremarkable. Musculoskeletal: Degenerative changes in the spine. Minimal retrolisthesis of L5 on S1. Degenerative changes in the hips, right greater than left. IMPRESSION: 1. No urinary stones or obstruction. 2. Right inguinal hernia contains the right anterolateral aspect of the bladder. 3.  Aortic atherosclerosis (ICD10-I70.0). Electronically Signed   By: Newell Eke M.D.   On: 01/26/2024 10:51     Medications Ordered in the ED  ondansetron [facility]) injection 4 mg (4 mg Intravenous Patient Refused/Not Given 01/26/24 0936)  ketorolac (TORADOL) 15 MG/ML injection 15 mg (15 mg Intravenous Given 01/26/24 0937)  sodium chloride 0.9 % bolus 1,000 mL (0 mLs Intravenous Stopped 01/26/24 1108)  iohexol (OMNIPAQUE) 300 MG/ML  solution 100 mL (100 mLs Intravenous Contrast Given 01/26/24 1016)                                   Medical Decision Making Amount and/or Complexity of Data Reviewed Labs: ordered. Radiology: ordered.  Risk Prescription drug management.   Patient is a 63 year old male with no significant medical history who presents to the ED for left-sided flank pain for the past 4 days.  Also notes associated nausea as well as urinary frequency.  Please see detailed HPI above.  On exam patient is alert and in no acute distress.  Physical exam as noted above.  Labs overall unremarkable including no leukocytosis, normal kidney function, no LFT elevation, and UA without signs of infection.  Patient given Toradol, Zofran, and fluids with improvement in symptoms.  CT of the abdomen pelvis and revealed and does not show acute nephrolithiasis or signs of cystitis.  There is however a right sided inguinal hernia that contains the anterolateral portion of the bladder.  Suspect this is most likely causing patient's urinary frequency.  On reevaluation, patient continues to have no pain in the groin area and states that the hernia has been present for approximately 3 years.  No acute signs of incarceration or strangulation.  Suspect patient's flank pain secondary to musculoskeletal pain.  Less concerns for pyelonephritis or nephrolithiasis with negative workup today.  Otherwise well-appearing, ambulatory, neurovascular intact.  Stable for outpatient treatment.  Prescribed naproxen and Flexeril for back pain.  Symptomatic care discussed.  Information for general surgery follow-up for inguinal hernia provided for follow-up and management.  He understands plan is agreeable.  Return precautions provided.  Case discussed with attending who agrees with assessment, plan, discharge today.     Final diagnoses:  Unilateral inguinal hernia without obstruction or gangrene, recurrence not specified  Urinary frequency  Left flank  pain    ED Discharge Orders          Ordered    naproxen (NAPROSYN) 500 MG tablet  2 times daily        01/26/24 1153    cyclobenzaprine (FLEXERIL) 10 MG tablet  2 times daily PRN        01/26/24 1153               Mertis Mosher S, PA-C 01/26/24 1219    Yolande Lamar BROCKS, MD 01/27/24 1254

## 2024-01-26 NOTE — ED Triage Notes (Signed)
 Pt states concern for UTI , states left side flank pain x 4 days that is constant  Increase in urination   Slight burning with urination, no blood noted per pt

## 2024-01-26 NOTE — Discharge Instructions (Signed)
 May take naproxen twice a day as needed for pain.  Do not take extra ibuprofen  at the same time.  May take Flexeril 3 times a day as needed for pain/muscle spasms.  Apply heat pads to the back to help with pain as well.  Please call general surgery office to schedule follow-up appointment for inguinal hernia that contains a portion of the bladder.  Return to ED sooner for any worsening symptoms including severe abdominal pain, uncontrolled nausea/vomiting, new fevers, severe pain in the groin.
